# Patient Record
Sex: Female | Born: 1980 | Race: White | Hispanic: No | Marital: Single | Smoking: Current every day smoker
Health system: Southern US, Community
[De-identification: ages and names within clinical notes are randomized; demographics above are authoritative.]

---

## 2017-07-25 ENCOUNTER — Ambulatory Visit (INDEPENDENT_AMBULATORY_CARE_PROVIDER_SITE_OTHER): Payer: BLUE CROSS/BLUE SHIELD

## 2017-07-25 ENCOUNTER — Ambulatory Visit
Admission: EM | Admit: 2017-07-25 | Discharge: 2017-07-25 | Disposition: A | Discharge status: Home / Self Care | Payer: BLUE CROSS/BLUE SHIELD | Attending: Family Medicine | Admitting: Family Medicine

## 2017-07-25 DIAGNOSIS — D72829 Elevated white blood cell count, unspecified: Secondary | ICD-10-CM

## 2017-07-25 DIAGNOSIS — R55 Syncope and collapse: Secondary | ICD-10-CM | POA: Diagnosis not present

## 2017-07-25 DIAGNOSIS — Z9889 Other specified postprocedural states: Secondary | ICD-10-CM | POA: Insufficient documentation

## 2017-07-25 DIAGNOSIS — Z3202 Encounter for pregnancy test, result negative: Secondary | ICD-10-CM | POA: Diagnosis not present

## 2017-07-25 DIAGNOSIS — F17210 Nicotine dependence, cigarettes, uncomplicated: Secondary | ICD-10-CM | POA: Diagnosis not present

## 2017-07-25 DIAGNOSIS — R42 Dizziness and giddiness: Secondary | ICD-10-CM | POA: Diagnosis not present

## 2017-07-25 DIAGNOSIS — R51 Headache: Secondary | ICD-10-CM | POA: Insufficient documentation

## 2017-07-25 LAB — URINALYSIS, COMPLETE (UACMP) WITH MICROSCOPIC
Bilirubin Urine: NEGATIVE
Glucose, UA: NEGATIVE mg/dL
Ketones, ur: NEGATIVE mg/dL
NITRITE: NEGATIVE
Protein, ur: NEGATIVE mg/dL
SPECIFIC GRAVITY, URINE: 1.025 (ref 1.005–1.030)
pH: 5 (ref 5.0–8.0)

## 2017-07-25 LAB — CBC WITH DIFFERENTIAL/PLATELET
Basophils Absolute: 0.1 10*3/uL (ref 0–0.1)
Basophils Relative: 1 %
Eosinophils Absolute: 0.1 10*3/uL (ref 0–0.7)
Eosinophils Relative: 1 %
HEMATOCRIT: 38.4 % (ref 35.0–47.0)
HEMOGLOBIN: 12.4 g/dL (ref 12.0–16.0)
LYMPHS ABS: 1.6 10*3/uL (ref 1.0–3.6)
LYMPHS PCT: 10 %
MCH: 27.9 pg (ref 26.0–34.0)
MCHC: 32.4 g/dL (ref 32.0–36.0)
MCV: 85.9 fL (ref 80.0–100.0)
Monocytes Absolute: 1.2 10*3/uL — ABNORMAL HIGH (ref 0.2–0.9)
Monocytes Relative: 8 %
NEUTROS PCT: 80 %
Neutro Abs: 12.5 10*3/uL — ABNORMAL HIGH (ref 1.4–6.5)
Platelets: 256 10*3/uL (ref 150–440)
RBC: 4.46 MIL/uL (ref 3.80–5.20)
RDW: 15.8 % — ABNORMAL HIGH (ref 11.5–14.5)
WBC: 15.4 10*3/uL — AB (ref 3.6–11.0)

## 2017-07-25 LAB — PREGNANCY, URINE: PREG TEST UR: NEGATIVE

## 2017-07-25 LAB — COMPREHENSIVE METABOLIC PANEL
ALK PHOS: 55 U/L (ref 38–126)
ALT: 37 U/L (ref 14–54)
AST: 30 U/L (ref 15–41)
Albumin: 3.7 g/dL (ref 3.5–5.0)
Anion gap: 9 (ref 5–15)
BILIRUBIN TOTAL: 0.7 mg/dL (ref 0.3–1.2)
BUN: 16 mg/dL (ref 6–20)
CALCIUM: 8.8 mg/dL — AB (ref 8.9–10.3)
CO2: 23 mmol/L (ref 22–32)
CREATININE: 0.72 mg/dL (ref 0.44–1.00)
Chloride: 104 mmol/L (ref 101–111)
Glucose, Bld: 107 mg/dL — ABNORMAL HIGH (ref 65–99)
Potassium: 3.8 mmol/L (ref 3.5–5.1)
Sodium: 136 mmol/L (ref 135–145)
Total Protein: 7.2 g/dL (ref 6.5–8.1)

## 2017-07-25 NOTE — Discharge Instructions (Signed)
Follow up tomorrow morning   (OR go to Emergency Department tonight if symptoms worsen; ie: fevers, chills, chest pain, shortness of breath, repeat episodes, numbness/tingling, vision loss, severe weakness, etc)

## 2017-07-25 NOTE — ED Triage Notes (Signed)
Pt set out for a bike ride today (no exertion, going downhill) around 12:00 and after about 5 min she reported to her partner that she was feeling a little dizzy. Got off the bike and her partner reported a syncopal episode lasting approx 1-2 min. Pt was not post-ictal (disoriented) followiing the event, but did not remember passing out. Has continued to feel slightly lightheaded during the afternoon. Denies pain

## 2017-07-25 NOTE — ED Provider Notes (Signed)
[facility]    CSN: [id number] Arrival date & time: 07/25/17  1537     History   Chief Complaint Chief Complaint  Patient presents with  . Loss of Consciousness    HPI Stacy Mcclain is a 36 y.o. female.   36 yo female accompanied by her boyfriend with a c/o loss of consciousness earlier today. Patient reports had just gone out for a bike ride with her boyfriend several minutes prior and had been going downhill, not exerting herself. They stopped at the downhill and patient told her boyfriend she felt a little dizzy then next thing she remembers is waking up on the ground and then sitting up. Boyfriend reports that after patient said she felt a little dizzy, then suddenly had "a blank stare", was not responding to him,  "eyes rolled up" and her "arms and legs contracted and got stiff". He says that he then grab her and laid her down on the ground. After "about a minute or two" patient became responsive again, but was "kind of confused", "disoriented".   Currently patient states she feels okay, except tired and has a headache. Denies any vision changes, fevers, chills, numbness/tingling, chest pains, palpitation, shortness of breath or one-sided weakness.    The history is provided by the patient.  Loss of Consciousness  Episode history:  Single Most recent episode:  Today Duration:  1 minute Progression:  Resolved Chronicity:  New Risk factors: no coronary artery disease and no vascular disease     History reviewed. No pertinent past medical history.  There are no active problems to display for this patient.   Past Surgical History:  Procedure Laterality Date  . CESAREAN SECTION      OB History    No data available       Home Medications    Prior to Admission medications   Not on File    Family History History reviewed. No pertinent family history.  Social History Social History  Substance Use Topics  . Smoking status: Current Every Day Smoker      Types: Cigarettes  . Smokeless tobacco: Never Used     Comment: smokes one cigarette daily, trying to quit  . Alcohol use Yes     Comment: several times weekly     Allergies   Patient has no known allergies.   Review of Systems Review of Systems  Cardiovascular: Positive for syncope.     Physical Exam Triage Vital Signs ED Triage Vitals  Enc Vitals Group     BP 07/25/17 1611 123/75     Pulse Rate 07/25/17 1611 85     Resp 07/25/17 1611 16     Temp 07/25/17 1611 98.4 F (36.9 C)     Temp Source 07/25/17 1611 Oral     SpO2 07/25/17 1611 100 %     Weight 07/25/17 1610 135 lb (61.2 kg)     Height 07/25/17 1610 5\' 2"  (1.575 m)     Head Circumference --      Peak Flow --      Pain Score --      Pain Loc --      Pain Edu? --      Excl. in GC? --    No data found.   Updated Vital Signs BP 110/67 (BP Location: Left Arm)   Pulse 87   Temp 98.3 F (36.8 C) (Oral)   Resp 16   Ht 5\' 2"  (1.575 m)   Wt 135 lb (  61.2 kg)   LMP 07/16/2017   SpO2 100%   BMI 24.69 kg/m   Visual Acuity Right Eye Distance:   Left Eye Distance:   Bilateral Distance:    Right Eye Near:   Left Eye Near:    Bilateral Near:     Physical Exam  Constitutional: She is oriented to person, place, and time. She appears well-developed and well-nourished. No distress.  HENT:  Head: Normocephalic.  Right Ear: Tympanic membrane, external ear and ear canal normal.  Left Ear: Tympanic membrane, external ear and ear canal normal.  Nose: Nose normal.  Mouth/Throat: Oropharynx is clear and moist and mucous membranes are normal. No tonsillar exudate.  Eyes: Pupils are equal, round, and reactive to light. Conjunctivae and EOM are normal. Right eye exhibits no discharge. Left eye exhibits no discharge. No scleral icterus.  Neck: Normal range of motion. Neck supple. No JVD present. No tracheal deviation present. No thyromegaly present.  Cardiovascular: Normal rate, regular rhythm, normal heart sounds  and intact distal pulses.   No murmur heard. Pulmonary/Chest: Effort normal and breath sounds normal. No stridor. No respiratory distress. She has no wheezes. She has no rales. She exhibits no tenderness.  Abdominal: Soft. Bowel sounds are normal. She exhibits no distension and no mass. There is no tenderness. There is no rebound and no guarding.  Musculoskeletal: She exhibits no edema or tenderness.  Lymphadenopathy:    She has no cervical adenopathy.  Neurological: She is alert and oriented to person, place, and time. She has normal reflexes. She displays normal reflexes. No cranial nerve deficit or sensory deficit. She exhibits normal muscle tone. Coordination normal.  Skin: Skin is warm and dry. No rash noted. She is not diaphoretic. No erythema. No pallor.  Psychiatric: She has a normal mood and affect. Her behavior is normal. Judgment and thought content normal.  Vitals reviewed.    UC Treatments / Results  Labs (all labs ordered are listed, but only abnormal results are displayed) Labs Reviewed  URINE CULTURE - Abnormal; Notable for the following:       Result Value   Culture MULTIPLE SPECIES PRESENT, SUGGEST RECOLLECTION (*)    All other components within normal limits  CBC WITH DIFFERENTIAL/PLATELET - Abnormal; Notable for the following:    WBC 15.4 (*)    RDW 15.8 (*)    Neutro Abs 12.5 (*)    Monocytes Absolute 1.2 (*)    All other components within normal limits  COMPREHENSIVE METABOLIC PANEL - Abnormal; Notable for the following:    Glucose, Bld 107 (*)    Calcium 8.8 (*)    All other components within normal limits  URINALYSIS, COMPLETE (UACMP) WITH MICROSCOPIC - Abnormal; Notable for the following:    Hgb urine dipstick MODERATE (*)    Leukocytes, UA TRACE (*)    Squamous Epithelial / LPF 0-5 (*)    Bacteria, UA RARE (*)    All other components within normal limits  PREGNANCY, URINE    EKG  EKG Interpretation None       Radiology No results  found.  Procedures ED EKG Date/Time: 07/25/2017 4:29 PM Performed by: Payton Mccallum Authorized by: Payton Mccallum   ECG reviewed by ED Physician in the absence of a cardiologist: yes   Previous ECG:    Previous ECG:  Unavailable Interpretation:    Interpretation: normal   Rate:    ECG rate:  83   ECG rate assessment: normal   Rhythm:    Rhythm: sinus  rhythm   Ectopy:    Ectopy: none   QRS:    QRS axis:  Normal Conduction:    Conduction: normal   ST segments:    ST segments:  Normal T waves:    T waves: normal      (including critical care time)  Medications Ordered in UC Medications - No data to display   Initial Impression / Assessment and Plan / UC Course  I have reviewed the triage vital signs and the nursing notes.  Pertinent labs & imaging results that were available during my care of the patient were reviewed by me and considered in my medical decision making (see chart for details).       Final Clinical Impressions(s) / UC Diagnoses   Final diagnoses:  Syncope, unspecified syncope type  Leukocytosis, unspecified type  (Possible first episode seizure)  New Prescriptions There are no discharge medications for this patient.  1. Labs/x-ray (CT head normal/negative) results and possible diagnosis reviewed with patient  2. Discussed case with on call neurologist for [facility] (Dr. Loretha BrasilZeylikman) regarding apparent 1st seizure episode, current normal physical exam, afebrile, but elevated white blood cell count. Neurologist explained elevated white blood cell count can be seen after seizure. Recommend follow up with neurologist for possible further evaluation (EEG?, MRI?) and management.   3. Recommend supportive treatment with rest, fluids and close monitoring and follow up; follow up tomorrow am for recheck exam and labs; if symptoms recur or worsen tonight recommend patient go to Emergency Department    Payton Mccallumonty, Landra Howze, MD 07/28/17 1155

## 2017-07-26 ENCOUNTER — Ambulatory Visit
Admission: EM | Admit: 2017-07-26 | Discharge: 2017-07-26 | Disposition: A | Discharge status: Home / Self Care | Payer: BLUE CROSS/BLUE SHIELD | Attending: Family Medicine | Admitting: Family Medicine

## 2017-07-26 DIAGNOSIS — R55 Syncope and collapse: Secondary | ICD-10-CM

## 2017-07-26 DIAGNOSIS — R51 Headache: Secondary | ICD-10-CM

## 2017-07-26 DIAGNOSIS — R569 Unspecified convulsions: Secondary | ICD-10-CM | POA: Diagnosis not present

## 2017-07-26 NOTE — Discharge Instructions (Signed)
Recommend follow up with a neurologist for further evaluation and management

## 2017-07-26 NOTE — ED Triage Notes (Signed)
Pt is here for follow up after yesterday and has some HA and back pain.

## 2017-07-27 ENCOUNTER — Telehealth: Payer: Self-pay

## 2017-07-27 NOTE — Telephone Encounter (Signed)
Patient called in [facility]. Advised once the notes are completed they will be faxed to [facility].

## 2017-07-28 ENCOUNTER — Telehealth: Payer: Self-pay

## 2017-07-28 LAB — URINE CULTURE: Special Requests: NORMAL

## 2017-07-28 NOTE — ED Provider Notes (Signed)
[facility]    CSN: [id number] Arrival date & time: 07/26/17  1052     History   Chief Complaint Chief Complaint  Patient presents with  . Headache    HPI Stacy Mcclain is a 36 y.o. female.   36 yo female seen here yesterday for loss of consciousness episode (apparent 1st seizure based on history), here for recheck. States she's doing better, feels well, other than a mild headache. Denies fevers, chills, neck stiffness, numbness/tingling. No further episodes since seen yesterday.  Patient is visiting from [place] and was supposed to fly back home today but canceled her flight. States she may stay here a little while longer and see a neurologist in the area.    The history is provided by the patient.  Headache    No past medical history on file.  There are no active problems to display for this patient.   Past Surgical History:  Procedure Laterality Date  . CESAREAN SECTION      OB History    No data available       Home Medications    Prior to Admission medications   Not on File    Family History No family history on file.  Social History Social History  Substance Use Topics  . Smoking status: Current Every Day Smoker    Types: Cigarettes  . Smokeless tobacco: Never Used     Comment: smokes one cigarette daily, trying to quit  . Alcohol use Yes     Comment: several times weekly     Allergies   Patient has no known allergies.   Review of Systems Review of Systems  Neurological: Positive for headaches.     Physical Exam Triage Vital Signs ED Triage Vitals  Enc Vitals Group     BP 07/26/17 1129 121/74     Pulse Rate 07/26/17 1129 72     Resp 07/26/17 1129 16     Temp 07/26/17 1129 98.1 F (36.7 C)     Temp Source 07/26/17 1129 Oral     SpO2 07/26/17 1129 100 %     Weight --      Height --      Head Circumference --      Peak Flow --      Pain Score 07/26/17 1131 6     Pain Loc --      Pain Edu? --      Excl. in GC? --     No data found.   Updated Vital Signs BP 121/74 (BP Location: Left Arm)   Pulse 72   Temp 98.1 F (36.7 C) (Oral)   Resp 16   LMP 07/16/2017   SpO2 100%   Visual Acuity Right Eye Distance:   Left Eye Distance:   Bilateral Distance:    Right Eye Near:   Left Eye Near:    Bilateral Near:     Physical Exam  Constitutional: She is oriented to person, place, and time. She appears well-developed and well-nourished. No distress.  HENT:  Head: Normocephalic and atraumatic.  Nose: No mucosal edema, rhinorrhea, nose lacerations, sinus tenderness, nasal deformity, septal deviation or nasal septal hematoma. No epistaxis.  No foreign bodies. Right sinus exhibits no maxillary sinus tenderness and no frontal sinus tenderness. Left sinus exhibits no maxillary sinus tenderness and no frontal sinus tenderness.  Mouth/Throat: Uvula is midline, oropharynx is clear and moist and mucous membranes are normal. No oropharyngeal exudate.  Eyes: Pupils are equal, round, and reactive  to light. Conjunctivae and EOM are normal. Right eye exhibits no discharge. Left eye exhibits no discharge. No scleral icterus.  Neck: Normal range of motion. Neck supple. No thyromegaly present.  Cardiovascular: Normal rate, regular rhythm and normal heart sounds.   Pulmonary/Chest: Effort normal and breath sounds normal. No respiratory distress. She has no wheezes. She has no rales.  Lymphadenopathy:    She has no cervical adenopathy.  Neurological: She is alert and oriented to person, place, and time. She displays normal reflexes. No cranial nerve deficit or sensory deficit. She exhibits normal muscle tone. Coordination normal.  Skin: She is not diaphoretic.  Nursing note and vitals reviewed.    UC Treatments / Results  Labs (all labs ordered are listed, but only abnormal results are displayed) Labs Reviewed - No data to display  EKG  EKG Interpretation None       Radiology No results  found.  Procedures Procedures (including critical care time)  Medications Ordered in UC Medications - No data to display   Initial Impression / Assessment and Plan / UC Course  I have reviewed the triage vital signs and the nursing notes.  Pertinent labs & imaging results that were available during my care of the patient were reviewed by me and considered in my medical decision making (see chart for details).       Final Clinical Impressions(s) / UC Diagnoses   Final diagnoses:  Syncope, unspecified syncope type  Seizure-like activity (HCC)    New Prescriptions There are no discharge medications for this patient.  1. Patient doing well/stable and no further episodes since one time episode yesterday; working diagnosis reviewed with patient and boyfriend (possible first time seizure)  2. Recommend follow up with a neurologist either at home [facility]) when she returns or here if she stays longer  3. Follow-up prn if symptoms worsen or don't improve  Controlled Substance Prescriptions [place] Controlled Substance Registry consulted? Not Applicable   Payton Mccallum, MD 07/28/17 [phone]

## 2017-07-28 NOTE — Telephone Encounter (Signed)
Ok to sent notes

## 2019-01-07 IMAGING — CR DG CHEST 2V
2 series · 2 of 2 positions shown · non-contrast
Comparison: None.

CLINICAL DATA: Syncope today.

EXAM:
CHEST  2 VIEW

[chest pa]
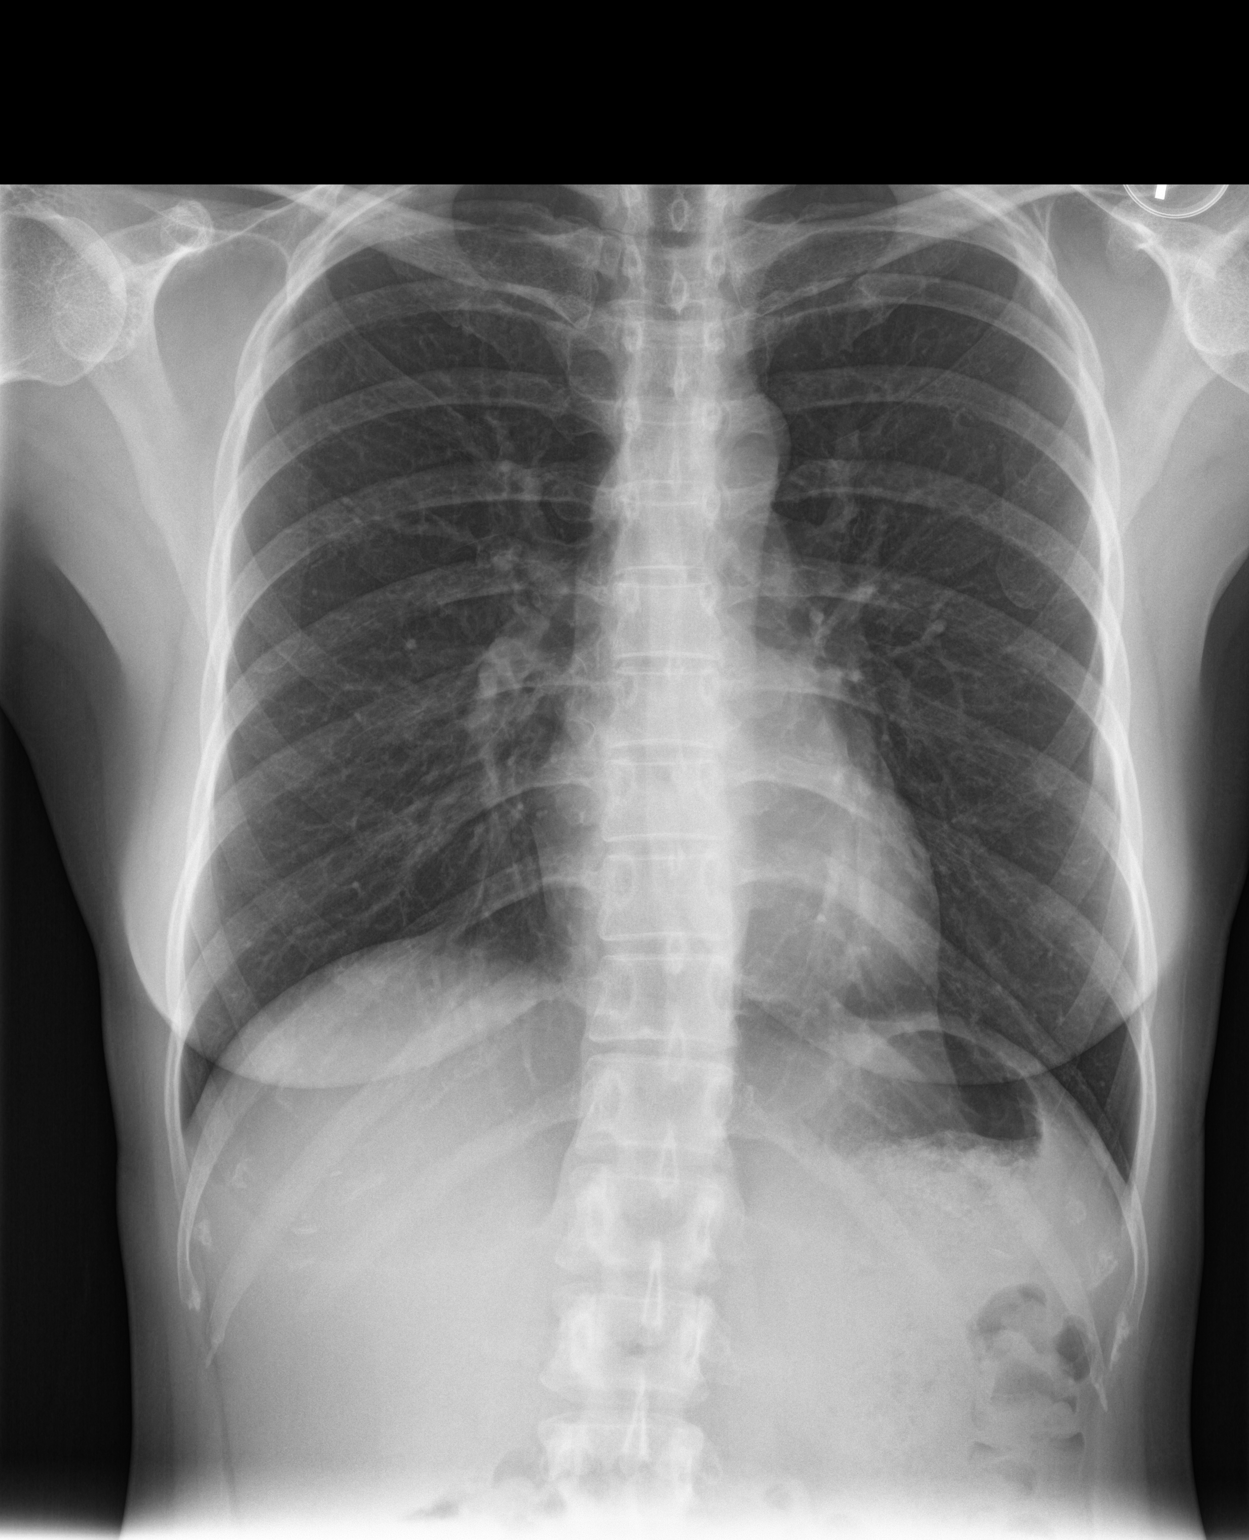

[chest lat]
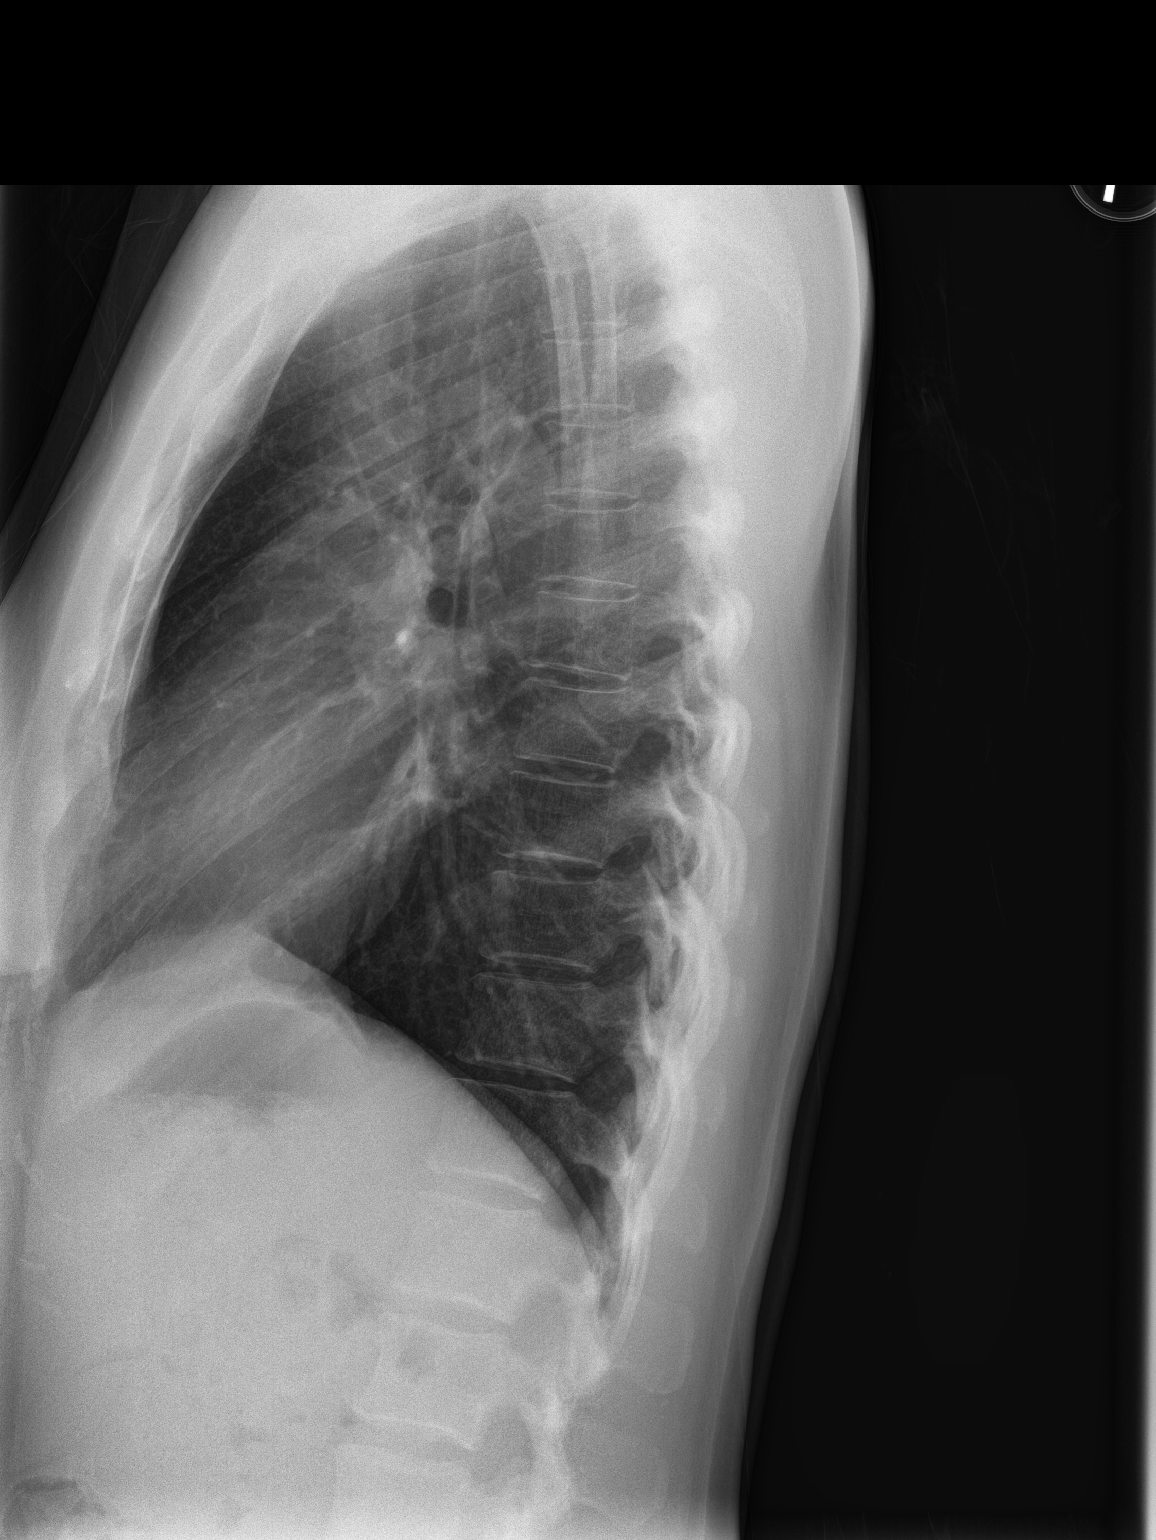

[2 of 2 positions shown; findings below may reference images not displayed]

FINDINGS: Heart size and mediastinal contours are normal. Lungs are clear. No
pleural effusion or pneumothorax seen. Osseous structures are
unremarkable.
IMPRESSION: No active cardiopulmonary disease.
